# Patient Record
Sex: Female | Born: 1965
Health system: Southern US, Community
[De-identification: ages and names within clinical notes are randomized; demographics above are authoritative.]

## PROBLEM LIST (undated history)

## (undated) DIAGNOSIS — I10 Essential (primary) hypertension: Secondary | ICD-10-CM

## (undated) HISTORY — DX: Essential (primary) hypertension: I10

---

## 2004-09-01 ENCOUNTER — Encounter: Admission: RE | Admit: 2004-09-01 | Discharge: 2004-09-01 | Payer: Self-pay | Admitting: Family Medicine

## 2004-10-29 ENCOUNTER — Emergency Department (HOSPITAL_COMMUNITY): Admission: EM | Admit: 2004-10-29 | Discharge: 2004-10-29 | Payer: Self-pay | Admitting: Emergency Medicine

## 2004-12-07 ENCOUNTER — Emergency Department (HOSPITAL_COMMUNITY): Admission: EM | Admit: 2004-12-07 | Discharge: 2004-12-08 | Payer: Self-pay | Admitting: Emergency Medicine

## 2006-05-19 ENCOUNTER — Emergency Department (HOSPITAL_COMMUNITY): Admission: EM | Admit: 2006-05-19 | Discharge: 2006-05-19 | Payer: Self-pay | Admitting: Emergency Medicine

## 2006-10-03 ENCOUNTER — Emergency Department (HOSPITAL_COMMUNITY): Admission: EM | Admit: 2006-10-03 | Discharge: 2006-10-03 | Payer: Self-pay | Admitting: Emergency Medicine

## 2006-11-28 ENCOUNTER — Emergency Department (HOSPITAL_COMMUNITY): Admission: EM | Admit: 2006-11-28 | Discharge: 2006-11-28 | Payer: Self-pay | Admitting: Emergency Medicine

## 2010-02-28 ENCOUNTER — Encounter: Payer: Self-pay | Admitting: *Deleted

## 2010-11-17 LAB — POCT CARDIAC MARKERS
CKMB, poc: <1 — ABNORMAL LOW
Myoglobin, poc: 42.9
Operator id: 196461
Troponin i, poc: <0.05

## 2015-10-03 ENCOUNTER — Encounter (HOSPITAL_BASED_OUTPATIENT_CLINIC_OR_DEPARTMENT_OTHER): Payer: Self-pay | Admitting: Emergency Medicine

## 2015-10-03 ENCOUNTER — Emergency Department (HOSPITAL_BASED_OUTPATIENT_CLINIC_OR_DEPARTMENT_OTHER): Payer: Self-pay

## 2015-10-03 ENCOUNTER — Emergency Department (HOSPITAL_BASED_OUTPATIENT_CLINIC_OR_DEPARTMENT_OTHER)
Admission: EM | Admit: 2015-10-03 | Discharge: 2015-10-04 | Disposition: A | Discharge status: Home / Self Care | Payer: Self-pay | Attending: Emergency Medicine | Admitting: Emergency Medicine

## 2015-10-03 DIAGNOSIS — G44209 Tension-type headache, unspecified, not intractable: Secondary | ICD-10-CM | POA: Insufficient documentation

## 2015-10-03 MED ORDER — SODIUM CHLORIDE 0.9 % IV BOLUS (SEPSIS)
1000.0000 mL | Freq: Once | INTRAVENOUS | Status: AC
  Administered 2015-10-03: 1000 mL via INTRAVENOUS

## 2015-10-03 MED ORDER — KETOROLAC TROMETHAMINE 30 MG/ML IJ SOLN
30.0000 mg | Freq: Once | INTRAMUSCULAR | Status: AC
  Administered 2015-10-04: 30 mg via INTRAVENOUS
  Filled 2015-10-03: qty 1

## 2015-10-03 MED ORDER — PROCHLORPERAZINE EDISYLATE 5 MG/ML IJ SOLN
10.0000 mg | Freq: Once | INTRAMUSCULAR | Status: AC
  Administered 2015-10-04: 10 mg via INTRAVENOUS
  Filled 2015-10-03: qty 2

## 2015-10-03 NOTE — ED Provider Notes (Signed)
[facility] Provider Note   CSN: [id number] Arrival date & time: 10/03/15  1859  By signing my name below, I, Doreatha MartinEva Mathews, attest that this documentation has been prepared under the direction and in the presence of  Eli Lilly and CompanyChristopher Leilene Diprima, PA-C. Electronically Signed: Doreatha MartinEva Mathews, ED Scribe. 10/03/15. 10:40 PM.    History   Chief Complaint Chief Complaint  Patient presents with  . Headache    HPI Rachel Ramirez is a 50 y.o. female who presents to the Emergency Department complaining of moderate, constant, throbbing left-sided HA onset yesterday. Pt states her HA begins at her left eye and radiates to her neck. No worsening factors noted. She states she has taken Tylenol with no relief of pain. She also reports some nasal congestion and a sensation of itching in her ears and throat this week. Pt states she does not normally have HA. No PMHx or FHx of migraines. She denies visual disturbance, photophobia, fever, chills, night sweats, neck stiffness.    The history is provided by the patient. No language interpreter was used.    History reviewed. No pertinent past medical history.  There are no active problems to display for this patient.   History reviewed. No pertinent surgical history.  OB History    No data available       Home Medications    Prior to Admission medications   Not on File    Family History History reviewed. No pertinent family history.  Social History Social History  Substance Use Topics  . Smoking status: Never Smoker  . Smokeless tobacco: Never Used  . Alcohol use No     Allergies   Review of patient's allergies indicates no known allergies.   Review of Systems Review of Systems A complete 10 system review of systems was obtained and all systems are negative except as noted in the HPI and PMH.    Physical Exam Updated Vital Signs BP 123/75 (BP Location: Right Arm)   Pulse (!) 58   Temp 98 F (36.7 C) (Oral)   Resp 18   Ht  5\' 4"  (1.626 m)   Wt 200 lb (90.7 kg)   SpO2 100%   BMI 34.33 kg/m   Physical Exam  Constitutional: She is oriented to person, place, and time. She appears well-developed and well-nourished.  HENT:  Head: Normocephalic.  Eyes: Conjunctivae and EOM are normal. Pupils are equal, round, and reactive to light.  Cardiovascular: Normal rate, regular rhythm and normal heart sounds.   No photophobia   Pulmonary/Chest: Effort normal and breath sounds normal. No respiratory distress.  Abdominal: She exhibits no distension.  Musculoskeletal: Normal range of motion.  Neurological: She is alert and oriented to person, place, and time. No cranial nerve deficit. Coordination normal.  Cranial nerves 2-12 grossly intact. Strength and sensation equal and intact bilaterally throughout the upper and lower extremities.Normal gait.   Skin: Skin is warm and dry.  Psychiatric: She has a normal mood and affect. Her behavior is normal.  Nursing note and vitals reviewed.   ED Treatments / Results  Labs (all labs ordered are listed, but only abnormal results are displayed) Labs Reviewed - No data to display  EKG  EKG Interpretation None       Radiology No results found.  Procedures Procedures (including critical care time)  DIAGNOSTIC STUDIES: Oxygen Saturation is 100% on RA, normal by my interpretation.    COORDINATION OF CARE: 10:22 PM Discussed treatment plan with pt at bedside which includes  CT scan and pt agreed to plan.    Medications Ordered in ED Medications - No data to display   Initial Impression / Assessment and Plan / ED Course  I have reviewed the triage vital signs and the nursing notes.  Pertinent labs & imaging results that were available during my care of the patient were reviewed by me and considered in my medical decision making (see chart for details).  Clinical Course    Patient is complete relief of her headache.  She did have a Compazine reaction.  We will  have the patient return here as needed.  Follow-up with her primary care Dr. Jaquita Folds her fluid intake   Charlestine Night, PA-C 10/04/15 0039    Laurence Spates, MD 10/04/15 [phone]

## 2015-10-03 NOTE — ED Triage Notes (Signed)
Patient reports that she has had pain to her head and neck x 2 days The patient points to her left head and left neck

## 2015-10-04 NOTE — Discharge Instructions (Signed)
Return here as needed.  Follow-up with your primary care Dr. increase her fluid intake and rest as much possible.  Your CT scan did not show any abnormality

## 2016-06-08 DIAGNOSIS — Z6835 Body mass index (BMI) 35.0-35.9, adult: Secondary | ICD-10-CM | POA: Diagnosis not present

## 2016-06-08 DIAGNOSIS — A6000 Herpesviral infection of urogenital system, unspecified: Secondary | ICD-10-CM | POA: Diagnosis not present

## 2016-06-08 DIAGNOSIS — Z1151 Encounter for screening for human papillomavirus (HPV): Secondary | ICD-10-CM | POA: Diagnosis not present

## 2016-06-08 DIAGNOSIS — Z01419 Encounter for gynecological examination (general) (routine) without abnormal findings: Secondary | ICD-10-CM | POA: Diagnosis not present

## 2016-06-08 DIAGNOSIS — Z803 Family history of malignant neoplasm of breast: Secondary | ICD-10-CM | POA: Diagnosis not present

## 2016-06-08 DIAGNOSIS — K649 Unspecified hemorrhoids: Secondary | ICD-10-CM | POA: Diagnosis not present

## 2016-06-13 DIAGNOSIS — Z1231 Encounter for screening mammogram for malignant neoplasm of breast: Secondary | ICD-10-CM | POA: Diagnosis not present

## 2016-06-14 ENCOUNTER — Telehealth: Payer: Self-pay | Admitting: *Deleted

## 2016-06-14 ENCOUNTER — Telehealth: Payer: Self-pay | Admitting: Cardiovascular Disease

## 2016-06-14 DIAGNOSIS — Z8249 Family history of ischemic heart disease and other diseases of the circulatory system: Secondary | ICD-10-CM | POA: Diagnosis not present

## 2016-06-14 DIAGNOSIS — R609 Edema, unspecified: Secondary | ICD-10-CM | POA: Diagnosis not present

## 2016-06-14 DIAGNOSIS — R079 Chest pain, unspecified: Secondary | ICD-10-CM | POA: Diagnosis not present

## 2016-06-14 DIAGNOSIS — I10 Essential (primary) hypertension: Secondary | ICD-10-CM | POA: Diagnosis not present

## 2016-06-14 NOTE — Telephone Encounter (Signed)
Received [facility] for appointment on 06/15/16 with Dr Tresa EndoKelly.  Records put with Dr Landry DykeKelly's schedule for 06/15/16. lp

## 2016-06-14 NOTE — Telephone Encounter (Signed)
NOTES SENT TO SCHEDULING.  °

## 2016-06-15 ENCOUNTER — Encounter: Payer: Self-pay | Admitting: Cardiovascular Disease

## 2016-06-15 ENCOUNTER — Ambulatory Visit (INDEPENDENT_AMBULATORY_CARE_PROVIDER_SITE_OTHER): Payer: Self-pay | Admitting: Cardiovascular Disease

## 2016-06-15 VITALS — BP 120/83 | HR 78 | Ht 64.0 in | Wt 202.0 lb

## 2016-06-15 DIAGNOSIS — K219 Gastro-esophageal reflux disease without esophagitis: Secondary | ICD-10-CM

## 2016-06-15 DIAGNOSIS — I10 Essential (primary) hypertension: Secondary | ICD-10-CM

## 2016-06-15 DIAGNOSIS — Z6834 Body mass index (BMI) 34.0-34.9, adult: Secondary | ICD-10-CM

## 2016-06-15 DIAGNOSIS — Z8249 Family history of ischemic heart disease and other diseases of the circulatory system: Secondary | ICD-10-CM

## 2016-06-15 DIAGNOSIS — R002 Palpitations: Secondary | ICD-10-CM

## 2016-06-15 DIAGNOSIS — R0789 Other chest pain: Secondary | ICD-10-CM

## 2016-06-15 DIAGNOSIS — E6609 Other obesity due to excess calories: Secondary | ICD-10-CM

## 2016-06-15 NOTE — Progress Notes (Signed)
Cardiology Office Note    Date:  06/15/2016   ID:  Rachel, Ramirez 02-16-1965, MRN [id number]  PCP:  Treasa School, PA-C  Cardiologist:  Nicki Guadalajara, MD   Chief Complaint  Patient presents with  . New Evaluation    pt c/o chest pain, fam hx heart disease and hypertension   Cardiology consultation referred by Guadalupe Dawn, [facility] for evaluation of chest pain, palpitations, patient with strong family history for CAD.  History of Present Illness:  Rachel Ramirez is a 51 y.o. female who is referred for cardiology consultation  by Guadalupe Dawn, [facility] for evaluation of chest pain, palpitations, in this patient with a strong family history for CAD.  [name] has a history of recurrent episodes of chest pain and has undergone multiple emergency room evaluations.  She describes her chest pain as twinges of discomfort which lasts seconds and ultimately stopped.  She also has noticed rare palpitation.  She denies any definitive exertional precipitation to her chest pain.  She was recently seen by Ephriam Knuckles in follow-up of her ER evaluations.  Reportedly her ECG was abnormal.  Remotely, she had undergone an echo Doppler study in 2005.  She has a strong family history for heart disease, both with her mother and father.  Her father died at age 45 and had heart failure and diabetes.  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  That time, she was only sleeping 4 hours per night.  Recently, she has adjusted her daytime hours.  She admits to fatigue.  She is unaware of snoring.  An Epworth Sleepiness Scale score was calculated in the office today and this endorsed at 7.  She presents for evaluation.   Past Medical History:  Diagnosis Date  . Hypertension     No past surgical history on file.  Current Medications: No outpatient prescriptions prior to visit.   No facility-administered  medications prior to visit.      Allergies:   Patient has no known allergies.   Social History   Social History  . Marital status: Married    Spouse name: N/A  . Number of children: N/A  . Years of education: N/A   Social History Main Topics  . Smoking status: Never Smoker  . Smokeless tobacco: Never Used  . Alcohol use No  . Drug use: Unknown  . Sexual activity: Not Asked   Other Topics Concern  . None   Social History Narrative  . None    Social history is notable that she is widowed.  Her husband died 2 years ago secondary to myocardial infarction at age 72.  He developed chest pain while working out at [employer]  Family History:  The patient's  family history includes Diabetes in her father; Heart disease in her mother; Heart failure in her father and son.   She has 5 brothers and 6 sisters.  She has 3 children, ages 61, 16, and 76 her 40 year old son has heart disease.  ROS General: Negative; No fevers, chills, or night sweats;  HEENT: Negative; No changes in vision or hearing, sinus congestion, difficulty swallowing Pulmonary: Negative; No cough, wheezing, shortness of breath, hemoptysis Cardiovascular:  See HPI GI: Negative; No nausea, vomiting, diarrhea, or abdominal pain GU: Negative; No dysuria, hematuria, or difficulty voiding Musculoskeletal: Negative; no myalgias, joint pain, or weakness Hematologic/Oncology: Negative; no easy bruising, bleeding Endocrine: Negative;  no heat/cold intolerance; no diabetes Neuro: Negative; no changes in balance, headaches Skin: Negative; No rashes or skin lesions Psychiatric: Negative; No behavioral problems, depression Sleep: Negative; No snoring, daytime sleepiness, hypersomnolence, bruxism, restless legs, hypnogognic hallucinations, no cataplexy Other comprehensive 14 point system review is negative.   PHYSICAL EXAM:   VS:  BP 120/83 (BP Location: Left Arm, Patient Position: Sitting, Cuff Size: Large)   Pulse 78   Ht 5'  4" (1.626 m)   Wt 202 lb (91.6 kg)   BMI 34.67 kg/m     Repeat blood pressure by me was 120/76  Wt Readings from Last 3 Encounters:  06/15/16 202 lb (91.6 kg)  10/03/15 200 lb (90.7 kg)    General: Alert, oriented, no distress.  Skin: normal turgor, no rashes, warm and dry HEENT: Normocephalic, atraumatic. Pupils equal round and reactive to light; sclera anicteric; extraocular muscles intact; Fundi No hemorrhages or exudates. Nose without nasal septal hypertrophy Mouth/Parynx benign; Mallinpatti scale Neck: No JVD, no carotid bruits; normal carotid upstroke Lungs: clear to ausculatation and percussion; no wheezing or rales Chest wall: without tenderness to palpitation Heart: PMI not displaced, RRR, s1 s2 normal, 1/6 systolic murmur, no diastolic murmur, no rubs, gallops, thrills, or heaves Abdomen: soft, nontender; no hepatosplenomehaly, BS+; abdominal aorta nontender and not dilated by palpation. Back: no CVA tenderness Pulses 2+ Musculoskeletal: full range of motion, normal strength, no joint deformities Extremities: no clubbing cyanosis or edema, Homan's sign negative  Neurologic: grossly nonfocal; Cranial nerves grossly wnl Psychologic: Normal mood and affect   Studies/Labs Reviewed:   EKG:  EKG is  ordered today.  ECG (independently read by me): Normal sinus rhythm at 66 bpm with mild sinus arrhythmia.  No cigarette ST-T changes.  Non-diagnostic T changes V2, V3.  Normal intervals.  Recent Labs: No flowsheet data found.   No flowsheet data found.  No flowsheet data found. No results found for: MCV No results found for: TSH No results found for: HGBA1C   BNP No results found for: BNP  ProBNP No results found for: PROBNP   Lipid Panel  No results found for: CHOL, TRIG, HDL, CHOLHDL, VLDL, LDLCALC, LDLDIRECT   RADIOLOGY: No results found.   Additional studies/ records that were reviewed today include:  I review the office records from Rachel Ramirez at  [place] [facility]    ASSESSMENT:    No diagnosis found.   PLAN:  Ms. Rachel Ramirez is a 51 year old female who has a history of hypertension and has been on hydrochlorothiazide 25 mg daily.  She also has a history of GERD and has been on omeprazole 40 mg daily.  LA, she has had recurrent episodes of lived nonexertional sharp twinges of chest pain.  Apparently, she has had multiple emergency room evaluations.  She specifically denies any exertional precipitation to her chest pain.  Of note, she recently has had very poor sleep duration secondary to working the third shift with her full-time job.  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  She states that she had only been sleeping typically 3-4 hours, but recently, she has reduced her day job hours and her sleep duration has improved.  She states yesterday her blood pressure was elevated, but she had not slept prior to it being taken.  Today, her blood pressure today after 6-7 hours of sleep last night is improved.  She has a systolic murmur.  I'm scheduling her for 2-D echo Doppler study for further evaluation.  With her atypical  chest pain, I'm scheduling her for a routine graded exercise treadmill test for initial baseline assessment for ischemic etiology.  I suspect her chest pain is most likely of musculoskeletal are urgent.  She is mildly obese with a BMI of 34.67.  Weight loss was recommended as well as increased exercise.  Unfortunately, her long work hours, make this difficult.  I have recommended she increase sleep duration to at least 7 hours per night.  If at all possible.  Her ECG today does not show any ischemic changes and shows nondiagnostic T changes with mild sinus arrhythmia.  I will see her back in the office in 6 weeks following the above studies and further recommendations will be made at that time.   Medication Adjustments/Labs and Tests Ordered: Current medicines are reviewed at length with the patient today.   Concerns regarding medicines are outlined above.  Medication changes, Labs and Tests ordered today are listed in the Patient Instructions below. There are no Patient Instructions on file for this visit.   Signed, Nicki Guadalajarahomas Dayon Witt, MD  06/15/2016 4:03 PM    [facility] [address], [address], [place], [place]  [address] Phone: [phone]

## 2016-06-15 NOTE — Patient Instructions (Signed)
Your physician has requested that you have an echocardiogram. Echocardiography is a painless test that uses sound waves to create images of your heart. It provides your doctor with information about the size and shape of your heart and how well your heart's chambers and valves are working. This procedure takes approximately one hour. There are no restrictions for this procedure.  Your physician has requested that you have en exercise stress myoview. For further information please visit [email or web address]. Please follow instruction sheet, as given.  Your physician recommends that you schedule a follow-up appointment in: 6 weeks

## 2016-06-28 ENCOUNTER — Other Ambulatory Visit: Payer: Self-pay

## 2016-06-28 ENCOUNTER — Ambulatory Visit (HOSPITAL_COMMUNITY): Payer: BLUE CROSS/BLUE SHIELD | Attending: Cardiovascular Disease

## 2016-06-28 DIAGNOSIS — R0789 Other chest pain: Secondary | ICD-10-CM

## 2016-06-28 DIAGNOSIS — R002 Palpitations: Secondary | ICD-10-CM | POA: Diagnosis not present

## 2016-06-28 DIAGNOSIS — Z8249 Family history of ischemic heart disease and other diseases of the circulatory system: Secondary | ICD-10-CM

## 2016-06-30 ENCOUNTER — Telehealth (HOSPITAL_COMMUNITY): Payer: Self-pay

## 2016-06-30 NOTE — Telephone Encounter (Signed)
Encounter complete. 

## 2016-07-01 ENCOUNTER — Ambulatory Visit (HOSPITAL_COMMUNITY)
Admission: RE | Admit: 2016-07-01 | Discharge: 2016-07-01 | Disposition: A | Discharge status: Home / Self Care | Payer: BLUE CROSS/BLUE SHIELD | Source: Ambulatory Visit | Attending: Cardiovascular Disease | Admitting: Cardiovascular Disease

## 2016-07-01 DIAGNOSIS — R002 Palpitations: Secondary | ICD-10-CM | POA: Diagnosis not present

## 2016-07-01 DIAGNOSIS — R0789 Other chest pain: Secondary | ICD-10-CM | POA: Diagnosis not present

## 2016-07-01 DIAGNOSIS — Z8249 Family history of ischemic heart disease and other diseases of the circulatory system: Secondary | ICD-10-CM | POA: Diagnosis not present

## 2016-07-01 LAB — EXERCISE TOLERANCE TEST
CHL CUP STRESS STAGE 1 GRADE: 0 %
CHL CUP STRESS STAGE 1 HR: 74 {beats}/min
CHL CUP STRESS STAGE 1 SPEED: 0 mph
CHL CUP STRESS STAGE 2 GRADE: 0 %
CHL CUP STRESS STAGE 2 HR: 75 {beats}/min
CHL CUP STRESS STAGE 2 SPEED: 0 mph
CHL CUP STRESS STAGE 3 DBP: 74 mmHg
CHL CUP STRESS STAGE 3 HR: 116 {beats}/min
CHL CUP STRESS STAGE 3 SBP: 155 mmHg
CHL CUP STRESS STAGE 3 SPEED: 1.7 mph
CHL CUP STRESS STAGE 4 DBP: 76 mmHg
CHL CUP STRESS STAGE 4 HR: 123 {beats}/min
CHL CUP STRESS STAGE 5 DBP: 76 mmHg
CHL CUP STRESS STAGE 5 SPEED: 3.4 mph
CHL CUP STRESS STAGE 6 GRADE: 16 %
CHL CUP STRESS STAGE 7 DBP: 69 mmHg
CHL CUP STRESS STAGE 7 GRADE: 0 %
CHL CUP STRESS STAGE 7 SPEED: 0 mph
CHL CUP STRESS STAGE 8 DBP: 82 mmHg
CHL CUP STRESS STAGE 8 GRADE: 0 %
CHL CUP STRESS STAGE 8 SBP: 154 mmHg
CSEPED: 10 min
CSEPEW: 11.8 METS
Exercise duration (sec): 2 s
MPHR: 171 {beats}/min
Peak HR: 151 {beats}/min
Percent HR: 88 %
Percent of predicted max HR: 88 %
RPE: 18
Rest HR: 65 {beats}/min
Stage 1 DBP: 90 mmHg
Stage 1 SBP: 128 mmHg
Stage 3 Grade: 10 %
Stage 4 Grade: 12 %
Stage 4 SBP: 144 mmHg
Stage 4 Speed: 2.5 mph
Stage 5 Grade: 14 %
Stage 5 HR: 137 {beats}/min
Stage 5 SBP: 155 mmHg
Stage 6 HR: 151 {beats}/min
Stage 6 Speed: 4.2 mph
Stage 7 HR: 126 {beats}/min
Stage 7 SBP: 152 mmHg
Stage 8 HR: 77 {beats}/min
Stage 8 Speed: 0 mph

## 2016-07-07 ENCOUNTER — Telehealth: Payer: Self-pay | Admitting: Cardiovascular Disease

## 2016-07-07 NOTE — Telephone Encounter (Signed)
Spoke with pt, aware of echo and GXT results

## 2016-07-07 NOTE — Telephone Encounter (Signed)
Left message for pt to call.

## 2016-07-07 NOTE — Telephone Encounter (Signed)
Mrs. Rachel Ramirez is returning a call about her echo results.  Please call .Marland Kitchen. Thanks

## 2016-07-25 ENCOUNTER — Encounter: Payer: Self-pay | Admitting: *Deleted

## 2016-07-25 ENCOUNTER — Ambulatory Visit: Payer: Self-pay | Admitting: Cardiovascular Disease

## 2016-10-04 DIAGNOSIS — N950 Postmenopausal bleeding: Secondary | ICD-10-CM | POA: Diagnosis not present

## 2017-06-06 IMAGING — CT CT HEAD W/O CM
3 series · 15 of 47 positions shown, 18 images · non-contrast
Comparison: None.

CLINICAL DATA: Left head neck pain for 2 days.  No known injury.

EXAM:
CT HEAD WITHOUT CONTRAST
TECHNIQUE: Contiguous axial images were obtained from the base of the skull
through the vertex without intravenous contrast.

[Series 2: head wo · axial · 0.47mm/px · z∈[+838,+968]mm · 9 of 32 slices shown, 12 images]
[im 3/32  brain]
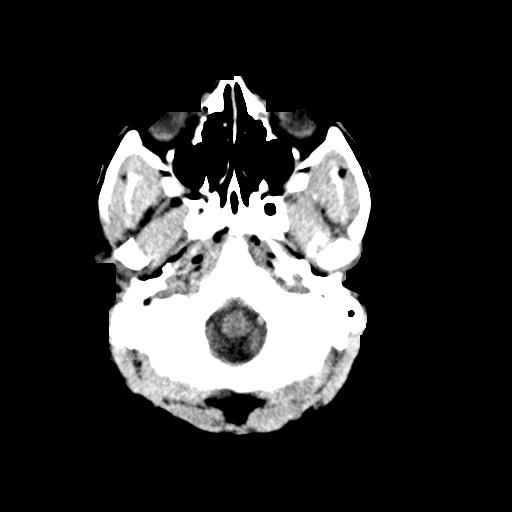
[im 3/32  bone]
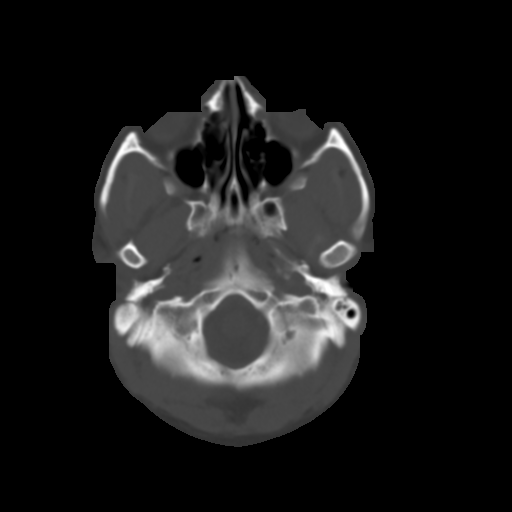
[im 6/32  brain]
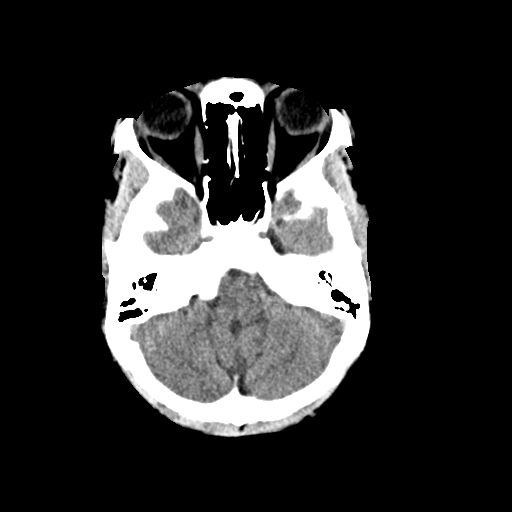
[im 9/32  brain]
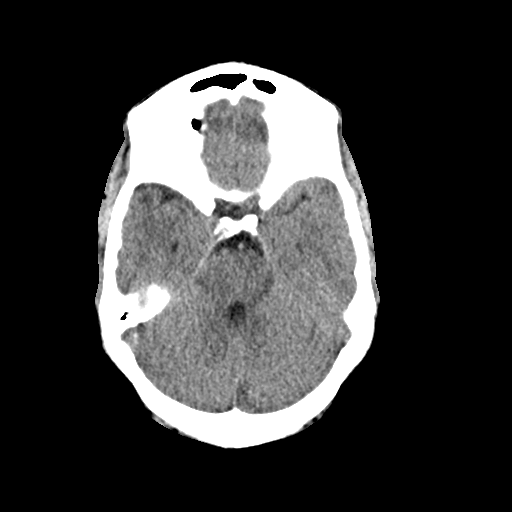
[im 12/32  brain]
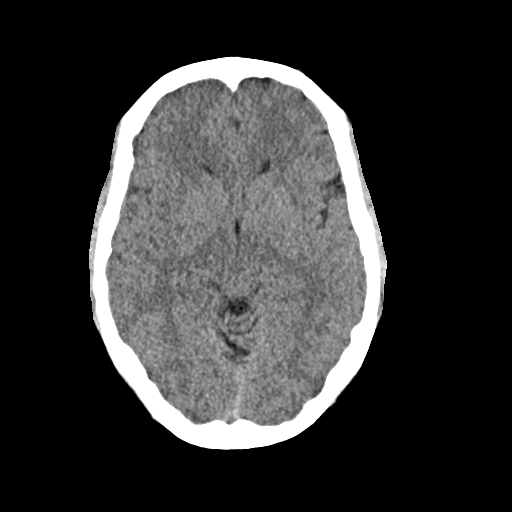
[im 17/32  brain]
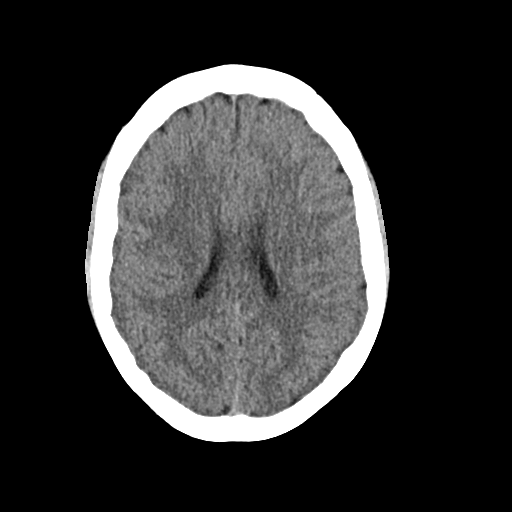
[im 17/32  bone]
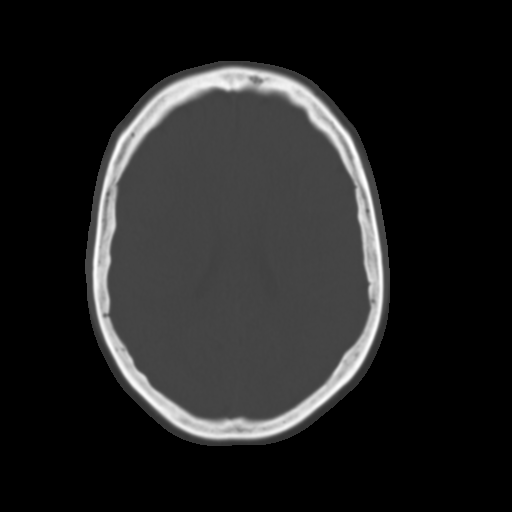
[im 20/32  brain]
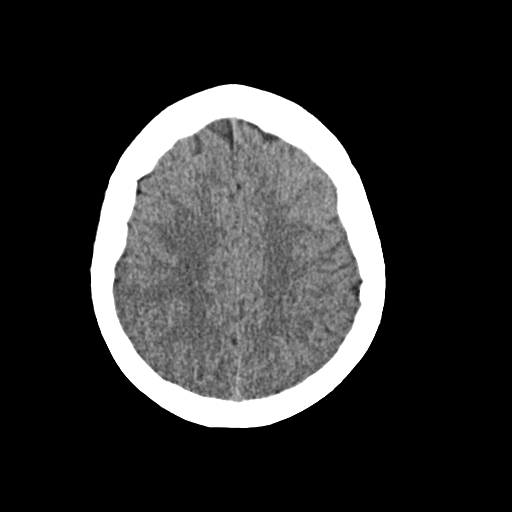
[im 23/32  brain]
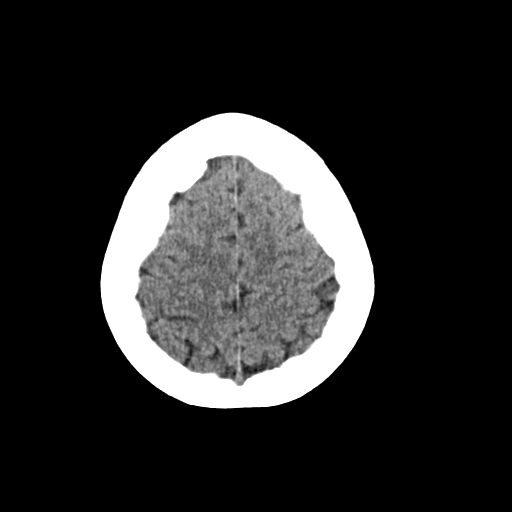
[im 26/32  brain]
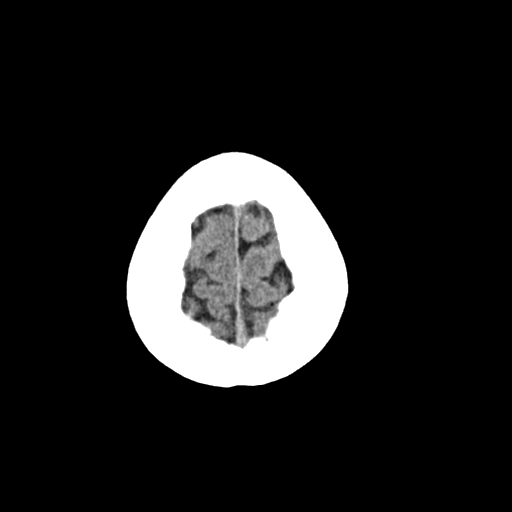
[im 29/32  brain]
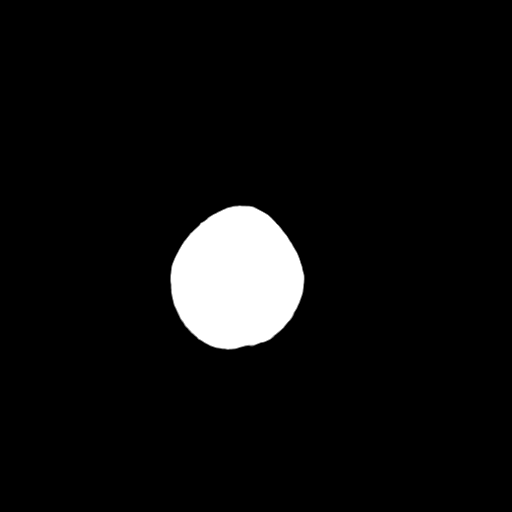
[im 29/32  bone]
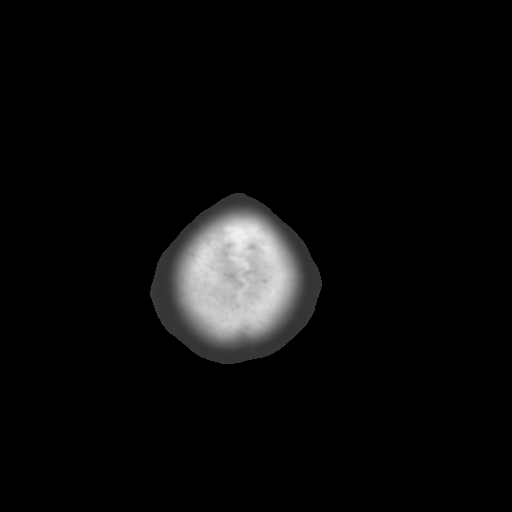

[Series 4: coronal soft · coronal · 0.31mm/px · 3 of 70 slices shown]
[im 24/70  brain]
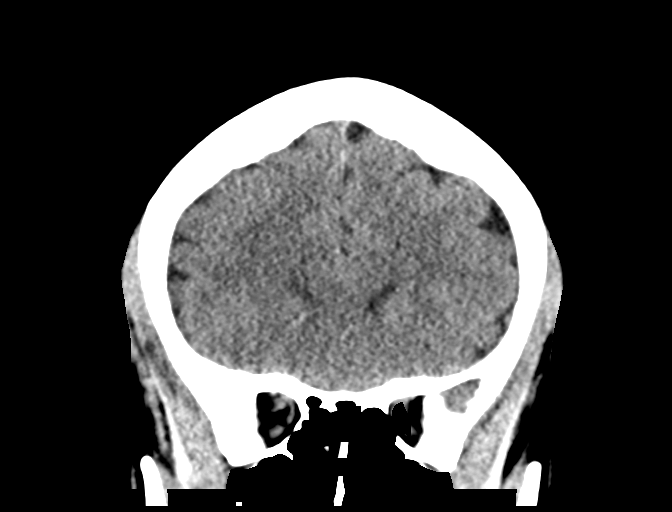
[im 31/70  brain]
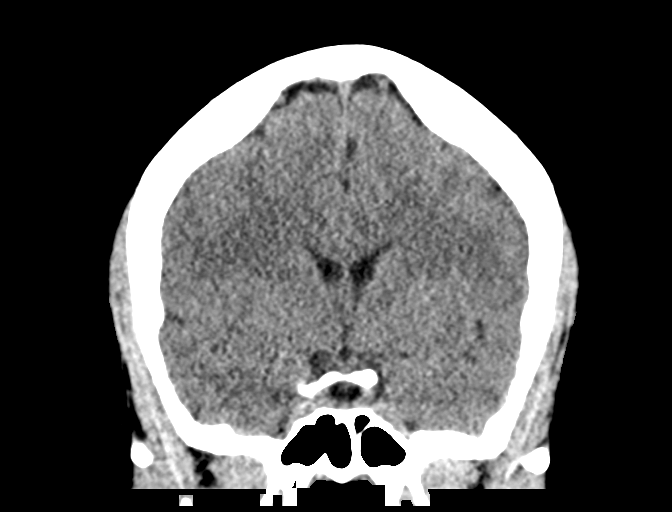
[im 39/70  brain]
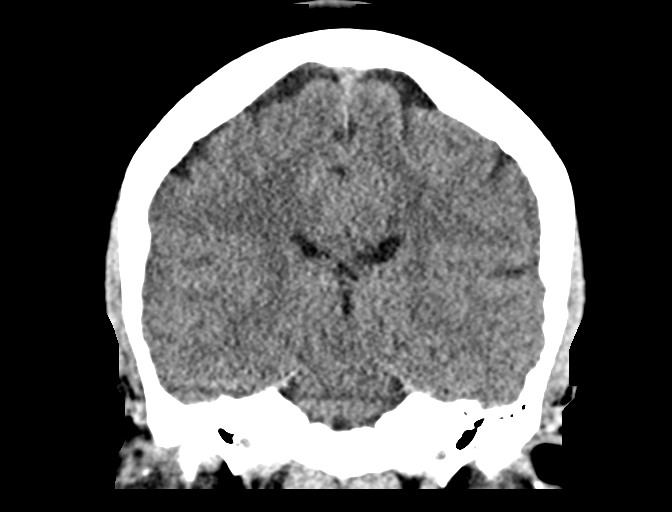

[Series 5: sag soft · sagittal · 0.31mm/px · 3 of 63 slices shown]
[im 21/63  brain]
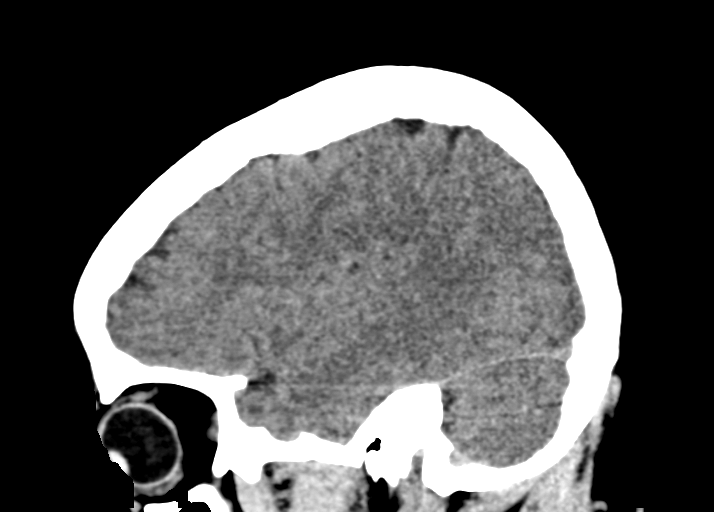
[im 32/63  brain]
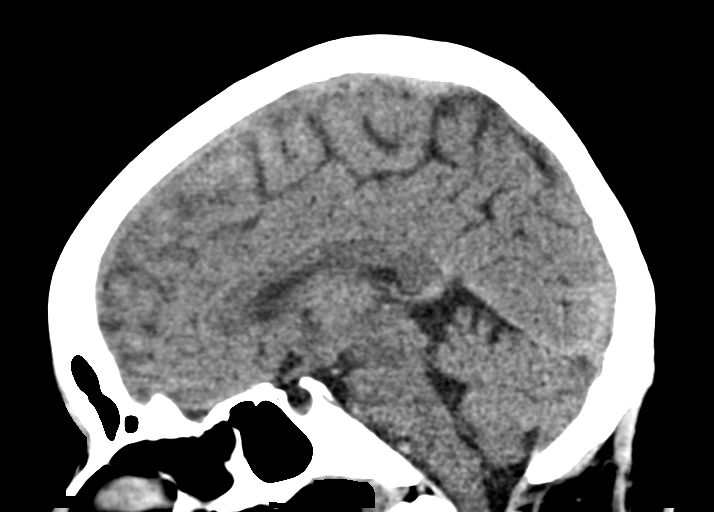
[im 42/63  brain]
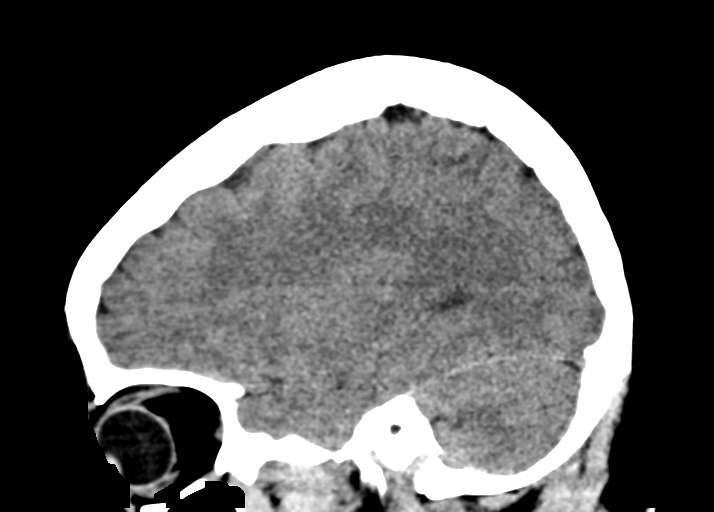

[15 of 47 positions shown; findings below may reference images not displayed]

FINDINGS: Brain: Ventricles and sulci appear symmetrical. No ventricular
dilatation. No mass effect or midline shift. No abnormal extra-axial
fluid collections. Gray-white matter junctions are distinct. Basal
cisterns are not effaced. No evidence of acute intracranial
hemorrhage.

Vascular: No hyperdense vessel or unexpected calcification.

Skull: No acute depressed skull fractures identified.

Sinuses/Orbits: Deformity of the right medial orbital wall likely
representing old fracture deformity. Visualized paranasal sinuses
and mastoid air cells are not opacified.

Other: Congenital nonunion of the posterior arch of C1.
IMPRESSION: No acute intracranial abnormalities.

## 2017-06-08 ENCOUNTER — Encounter: Payer: Self-pay | Admitting: Gastroenterology

## 2017-07-26 ENCOUNTER — Ambulatory Visit: Payer: BLUE CROSS/BLUE SHIELD | Admitting: Gastroenterology

## 2017-10-16 ENCOUNTER — Ambulatory Visit (INDEPENDENT_AMBULATORY_CARE_PROVIDER_SITE_OTHER): Payer: BLUE CROSS/BLUE SHIELD

## 2017-10-16 ENCOUNTER — Encounter: Payer: Self-pay | Admitting: Podiatry

## 2017-10-16 ENCOUNTER — Ambulatory Visit: Payer: BLUE CROSS/BLUE SHIELD | Admitting: Podiatry

## 2017-10-16 ENCOUNTER — Other Ambulatory Visit: Payer: Self-pay | Admitting: Podiatry

## 2017-10-16 VITALS — BP 129/89 | HR 67 | Resp 16

## 2017-10-16 DIAGNOSIS — M778 Other enthesopathies, not elsewhere classified: Secondary | ICD-10-CM

## 2017-10-16 DIAGNOSIS — M79671 Pain in right foot: Secondary | ICD-10-CM

## 2017-10-16 DIAGNOSIS — M722 Plantar fascial fibromatosis: Secondary | ICD-10-CM | POA: Diagnosis not present

## 2017-10-16 DIAGNOSIS — M779 Enthesopathy, unspecified: Secondary | ICD-10-CM

## 2017-10-16 MED ORDER — DICLOFENAC SODIUM 75 MG PO TBEC: 75.0000 mg | DELAYED_RELEASE_TABLET | Freq: Two times a day (BID) | ORAL | 2 refills | Status: AC

## 2017-10-16 MED ORDER — TRIAMCINOLONE ACETONIDE 10 MG/ML IJ SUSP
10.0000 mg | Freq: Once | INTRAMUSCULAR | Status: AC
  Administered 2017-10-16: 10 mg

## 2017-10-16 NOTE — Progress Notes (Signed)
   Subjective:    Patient ID: Rachel Ramirez, female    DOB: 07/16/1965, 52 y.o.   MRN: [id number]  HPI    Review of Systems  All other systems reviewed and are negative.      Objective:   Physical Exam        Assessment & Plan:

## 2017-10-16 NOTE — Patient Instructions (Signed)

## 2017-10-17 NOTE — Progress Notes (Signed)
Subjective:   Patient ID: Rachel Ramirez, female   DOB: 52 y.o.   MRN: [id number]   HPI Patient states that she is having a lot of pain in the plantar aspect of the right heel and discomfort in the entire foot left of a moderate nature as she feels like she is been walking differently.  She works on concrete floors all day and states the pain is been present for several months and worse when she gets up in the morning and after sitting.  Patient does not smoke and likes to be active   Review of Systems  All other systems reviewed and are negative.       Objective:  Physical Exam  Constitutional: She appears well-developed and well-nourished.  Cardiovascular: Intact distal pulses.  Pulmonary/Chest: Effort normal.  Musculoskeletal: Normal range of motion.  Neurological: She is alert.  Skin: Skin is warm.  Nursing note and vitals reviewed.   Neurovascular status found to be intact muscle strength is adequate range of motion within normal limits with patient found to have exquisite discomfort around the plantar fascia right at the insertion of the tendon into the calcaneus with left foot showing mild discomfort within the entire foot but no discrete or acute area of inflammation.  Patient does have moderate depression of the arch and is found to have good digital perfusion and is well oriented x3     Assessment:  Acute plantar fasciitis right and tendinitis left with inflammation fluid around the medial band right foot     Plan:  H&P all conditions reviewed and today I injected the plantar fascia right 3 mg Kenalog 5 mg Xylocaine and for the left of advised on ice therapy and placed on diclofenac 75 mg twice daily.  Reappoint to recheck again in the next several weeks and fascial brace was dispensed with instructions on usage  X-ray indicates spur formation plantar aspect heel right over left with no indications of stress fracture left or other pathology

## 2017-11-06 ENCOUNTER — Encounter: Payer: Self-pay | Admitting: Podiatry

## 2017-11-06 ENCOUNTER — Ambulatory Visit: Payer: BLUE CROSS/BLUE SHIELD | Admitting: Podiatry

## 2017-11-06 DIAGNOSIS — M722 Plantar fascial fibromatosis: Secondary | ICD-10-CM

## 2017-11-06 DIAGNOSIS — M109 Gout, unspecified: Secondary | ICD-10-CM

## 2017-11-06 MED ORDER — TRIAMCINOLONE ACETONIDE 10 MG/ML IJ SUSP
10.0000 mg | Freq: Once | INTRAMUSCULAR | Status: AC
  Administered 2017-11-06: 10 mg

## 2017-11-06 MED ORDER — PREDNISONE 10 MG PO TABS: ORAL_TABLET | ORAL | 0 refills | Status: AC

## 2017-11-06 NOTE — Patient Instructions (Signed)
Gout Gout is painful swelling that can happen in some of your joints. Gout is a type of arthritis. This condition is caused by having too much uric acid in your body. Uric acid is a chemical that is made when your body breaks down substances called purines. If your body has too much uric acid, sharp crystals can form and build up in your joints. This causes pain and swelling. Gout attacks can happen quickly and be very painful (acute gout). Over time, the attacks can affect more joints and happen more often (chronic gout). Follow these instructions at home: During a Gout Attack  If directed, put ice on the painful area: ? Put ice in a plastic bag. ? Place a towel between your skin and the bag. ? Leave the ice on for 20 minutes, 2-3 times a day.  Rest the joint as much as possible. If the joint is in your leg, you may be given crutches to use.  Raise (elevate) the painful joint above the level of your heart as often as you can.  Drink enough fluids to keep your pee (urine) clear or pale yellow.  Take over-the-counter and prescription medicines only as told by your doctor.  Do not drive or use heavy machinery while taking prescription pain medicine.  Follow instructions from your doctor about what you can or cannot eat and drink.  Return to your normal activities as told by your doctor. Ask your doctor what activities are safe for you. Avoiding Future Gout Attacks  Follow a low-purine diet as told by a specialist (dietitian) or your doctor. Avoid foods and drinks that have a lot of purines, such as: ? Liver. ? Kidney. ? Anchovies. ? Asparagus. ? Herring. ? Mushrooms ? Mussels. ? Beer.  Limit alcohol intake to no more than 1 drink a day for nonpregnant women and 2 drinks a day for men. One drink equals 12 oz of beer, 5 oz of wine, or 1 oz of hard liquor.  Stay at a healthy weight or lose weight if you are overweight. If you want to lose weight, talk with your doctor. It is  important that you do not lose weight too fast.  Start or continue an exercise plan as told by your doctor.  Drink enough fluids to keep your pee clear or pale yellow.  Take over-the-counter and prescription medicines only as told by your doctor.  Keep all follow-up visits as told by your doctor. This is important. Contact a doctor if:  You have another gout attack.  You still have symptoms of a gout attack after10 days of treatment.  You have problems (side effects) because of your medicines.  You have chills or a fever.  You have burning pain when you pee (urinate).  You have pain in your lower back or belly. Get help right away if:  You have very bad pain.  Your pain cannot be controlled.  You cannot pee. This information is not intended to replace advice given to you by your health care provider. Make sure you discuss any questions you have with your health care provider. Document Released: 11/03/2007 Document Revised: 07/02/2015 Document Reviewed: 11/06/2014 Elsevier Interactive Patient Education  2018 [employer]vier Inc.  

## 2017-11-06 NOTE — Progress Notes (Signed)
Subjective:   Patient ID: Rachel Ramirez, female   DOB: 52 y.o.   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  Patient be seen back in 2 weeks or earlier and we can allow him to the office 2 weeks work as it does seem to be contributing to the acute inflammation she is experiencing

## 2017-11-07 LAB — ANA, IFA COMPREHENSIVE PANEL
Anti Nuclear Antibody(ANA): NEGATIVE
ENA SM Ab Ser-aCnc: <1 AI
SCLERODERMA (SCL-70) (ENA) ANTIBODY, IGG: NEGATIVE AI
SM/RNP: <1 AI
SSA (Ro) (ENA) Antibody, IgG: <1 AI
SSB (La) (ENA) Antibody, IgG: <1 AI

## 2017-11-07 LAB — RHEUMATOID FACTOR: Rhuematoid fact SerPl-aCnc: <14 IU/mL (ref <14)

## 2017-11-07 LAB — C-REACTIVE PROTEIN: CRP: 1.1 mg/L (ref <8.0)

## 2017-11-07 LAB — SEDIMENTATION RATE: SED RATE: 2 mm/h (ref 0–20)

## 2017-11-07 LAB — URIC ACID: Uric Acid, Serum: 4.6 mg/dL (ref 2.5–7.0)

## 2017-11-20 ENCOUNTER — Encounter: Payer: Self-pay | Admitting: Podiatry

## 2017-11-20 ENCOUNTER — Ambulatory Visit: Payer: BLUE CROSS/BLUE SHIELD | Admitting: Podiatry

## 2017-11-20 DIAGNOSIS — M722 Plantar fascial fibromatosis: Secondary | ICD-10-CM | POA: Diagnosis not present

## 2017-11-21 NOTE — Progress Notes (Signed)
Subjective:   Patient ID: Rachel Ramirez, female   DOB: 52 y.o.   MRN: [id number]   HPI Patient states her feet are feeling quite a bit better but she is more worried about the long-term as she wants to be more active   ROS      Objective:  Physical Exam  Neurovascular status intact with patient found to have mild discomfort plantar heel region bilateral with moderate depression of the arch noted     Assessment:  Tendinitis bilateral that is improving with pain still noted in the medial band of the fascia bilateral     Plan:  Reviewed condition and at this point recommended long-term orthotics and scanned for customized orthotics to lift the arch is up and discussed continued physical therapy shoe gear modifications and reviewed everything involved with this condition at great length.  Reappoint for [place] to recheck when orthotics are ready or earlier if any issues should occur

## 2017-12-11 ENCOUNTER — Ambulatory Visit (INDEPENDENT_AMBULATORY_CARE_PROVIDER_SITE_OTHER): Payer: Self-pay | Admitting: Orthotics

## 2017-12-11 DIAGNOSIS — M778 Other enthesopathies, not elsewhere classified: Secondary | ICD-10-CM

## 2017-12-11 DIAGNOSIS — M779 Enthesopathy, unspecified: Secondary | ICD-10-CM

## 2017-12-11 DIAGNOSIS — M722 Plantar fascial fibromatosis: Secondary | ICD-10-CM

## 2017-12-11 NOTE — Progress Notes (Signed)
Patient came in today to pick up custom made foot orthotics.  The goals were accomplished and the patient reported no dissatisfaction with said orthotics.  Patient was advised of breakin period and how to report any issues. 

## 2019-09-20 DIAGNOSIS — Z01419 Encounter for gynecological examination (general) (routine) without abnormal findings: Secondary | ICD-10-CM | POA: Diagnosis not present

## 2019-09-20 DIAGNOSIS — Z1231 Encounter for screening mammogram for malignant neoplasm of breast: Secondary | ICD-10-CM | POA: Diagnosis not present

## 2019-09-20 DIAGNOSIS — Z130 Encounter for screening for diseases of the blood and blood-forming organs and certain disorders involving the immune mechanism: Secondary | ICD-10-CM | POA: Diagnosis not present

## 2019-09-20 DIAGNOSIS — Z6833 Body mass index (BMI) 33.0-33.9, adult: Secondary | ICD-10-CM | POA: Diagnosis not present

## 2019-09-20 DIAGNOSIS — Z803 Family history of malignant neoplasm of breast: Secondary | ICD-10-CM | POA: Diagnosis not present

## 2019-09-20 DIAGNOSIS — Z114 Encounter for screening for human immunodeficiency virus [HIV]: Secondary | ICD-10-CM | POA: Diagnosis not present

## 2019-09-20 DIAGNOSIS — Z13220 Encounter for screening for lipoid disorders: Secondary | ICD-10-CM | POA: Diagnosis not present

## 2019-09-20 DIAGNOSIS — Z113 Encounter for screening for infections with a predominantly sexual mode of transmission: Secondary | ICD-10-CM | POA: Diagnosis not present

## 2019-09-20 DIAGNOSIS — Z118 Encounter for screening for other infectious and parasitic diseases: Secondary | ICD-10-CM | POA: Diagnosis not present

## 2019-09-20 DIAGNOSIS — Z131 Encounter for screening for diabetes mellitus: Secondary | ICD-10-CM | POA: Diagnosis not present

## 2019-09-20 DIAGNOSIS — Z1329 Encounter for screening for other suspected endocrine disorder: Secondary | ICD-10-CM | POA: Diagnosis not present

## 2019-09-20 DIAGNOSIS — Z1159 Encounter for screening for other viral diseases: Secondary | ICD-10-CM | POA: Diagnosis not present

## 2019-09-20 DIAGNOSIS — Z0000 Encounter for general adult medical examination without abnormal findings: Secondary | ICD-10-CM | POA: Diagnosis not present

## 2019-10-07 DIAGNOSIS — Z03818 Encounter for observation for suspected exposure to other biological agents ruled out: Secondary | ICD-10-CM | POA: Diagnosis not present

## 2019-10-07 DIAGNOSIS — Z20822 Contact with and (suspected) exposure to covid-19: Secondary | ICD-10-CM | POA: Diagnosis not present

## 2019-11-16 DIAGNOSIS — Z03818 Encounter for observation for suspected exposure to other biological agents ruled out: Secondary | ICD-10-CM | POA: Diagnosis not present

## 2019-11-16 DIAGNOSIS — Z20822 Contact with and (suspected) exposure to covid-19: Secondary | ICD-10-CM | POA: Diagnosis not present
